# Patient Record
Sex: Female | Born: 2005 | Race: White | Hispanic: Yes | Marital: Single | Smoking: Never smoker
Health system: Southern US, Community
[De-identification: ages and names within clinical notes are randomized; demographics above are authoritative.]

## PROBLEM LIST (undated history)

## (undated) DIAGNOSIS — E739 Lactose intolerance, unspecified: Secondary | ICD-10-CM

## (undated) DIAGNOSIS — Z9109 Other allergy status, other than to drugs and biological substances: Secondary | ICD-10-CM

## (undated) DIAGNOSIS — J302 Other seasonal allergic rhinitis: Secondary | ICD-10-CM

---
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
## 2024-08-07 NOTE — Progress Notes (Signed)
 18 y.o. G0P0000 female here for new patient annual exam. Single. Presents with her mom. Lives with her dad.  Patient's last menstrual period was 07/29/2024 (exact date). Period Duration (Days): 4 Period Pattern: Regular Menstrual Flow: Heavy Menstrual Control: Maxi pad Dysmenorrhea: (!) Moderate Dysmenorrhea Symptoms: Headache  She reports establish care with GYN. She reports severe dysmenorrhea on CD1-3. Urine sample provided: No  Birth control: None Sexually active: 1 lifetime partner, +SA at age 35yo. Reports his parents were HIV positive, so she is worried. Gardasil: completed per mom  Flowsheet Row Office Visit from 08/08/2024 in [facility]  PHQ-2 Total Score 0    GYN HISTORY: No sig hx  OB History  Gravida Para Term Preterm AB Living  0 0 0 0 0   SAB IAB Ectopic Multiple Live Births  0 0 0     Past Medical History:  Diagnosis Date   Environmental allergies    Lactose intolerance    Seasonal allergies    History reviewed. No pertinent surgical history. No current outpatient medications on file prior to visit.   No current facility-administered medications on file prior to visit.   Allergies  Allergen Reactions   Lactose Intolerance (Gi)    Wheat       PE Today's Vitals   08/08/24 1517  BP: 104/74  Pulse: 99  Temp: 98.2 F (36.8 C)  TempSrc: Oral  SpO2: 99%  Weight: 225 lb (102.1 kg)  Height: 5' 1 (1.549 m)   Body mass index is 42.51 kg/m.  Physical Exam Vitals reviewed.  Constitutional:      General: She is not in acute distress.    Appearance: Normal appearance.  HENT:     Head: Normocephalic and atraumatic.     Nose: Nose normal.  Eyes:     Extraocular Movements: Extraocular movements intact.     Conjunctiva/sclera: Conjunctivae normal.  Pulmonary:     Effort: Pulmonary effort is normal.  Musculoskeletal:        General: Normal range of motion.     Cervical back: Normal range of motion.   Neurological:     General: No focal deficit present.     Mental Status: She is alert.  Psychiatric:        Mood and Affect: Mood normal.        Behavior: Behavior normal.      Assessment and Plan:        Well woman exam with routine gynecological exam Assessment & Plan: Encouraged safe sexual practices as indicated Encouraged healthy lifestyle practices with diet and exercise For patients under 50yo, I recommend 1000mg  calcium daily and 600IU of vitamin D daily.   Orders: -     CBC -     COMPLETE METABOLIC PANEL WITHOUT GFR  Negative depression screening  Screen for STD (sexually transmitted disease) -     Hepatitis B surface antigen -     Hepatitis C antibody -     RPR -     HIV Antibody (routine testing w rflx) -     C. trachomatis/N. gonorrhoeae RNA  Dysmenorrhea Assessment & Plan: Patient reporting severe dysmenorrhea. She is currently managing with Tylenol  and ibuprofen . Ultrasound isnot indicated based on symptoms. Discussed initial management, including NSAIDS and hormonal therapy. Patient elects for NSAIDS. RTO in 3 monts.   Orders: -     Naproxen; Take 1 tablet (500 mg total) by mouth 2 (two) times daily with a meal. Start 2 days  before your cycle. Take up to 5d per cycle.  Dispense: 30 tablet; Refill: 3    Vera LULLA Pa, MD

## 2024-08-08 ENCOUNTER — Encounter: Payer: Self-pay | Admitting: Obstetrics and Gynecology

## 2024-08-08 ENCOUNTER — Ambulatory Visit: Admitting: Obstetrics and Gynecology

## 2024-08-08 VITALS — BP 104/74 | HR 99 | Temp 98.2°F | Ht 61.0 in | Wt 225.0 lb

## 2024-08-08 DIAGNOSIS — Z01419 Encounter for gynecological examination (general) (routine) without abnormal findings: Secondary | ICD-10-CM | POA: Insufficient documentation

## 2024-08-08 DIAGNOSIS — Z1331 Encounter for screening for depression: Secondary | ICD-10-CM

## 2024-08-08 DIAGNOSIS — Z113 Encounter for screening for infections with a predominantly sexual mode of transmission: Secondary | ICD-10-CM

## 2024-08-08 DIAGNOSIS — N946 Dysmenorrhea, unspecified: Secondary | ICD-10-CM | POA: Insufficient documentation

## 2024-08-08 MED ORDER — NAPROXEN 500 MG PO TABS: 500.0000 mg | ORAL_TABLET | Freq: Two times a day (BID) | ORAL | 3 refills | Status: AC

## 2024-08-08 NOTE — Assessment & Plan Note (Signed)
 Encouraged safe sexual practices as indicated Encouraged healthy lifestyle practices with diet and exercise For patients under 19yo, I recommend 1000mg  calcium daily and 600IU of vitamin D daily.

## 2024-08-08 NOTE — Assessment & Plan Note (Signed)
 Patient reporting severe dysmenorrhea. She is currently managing with Tylenol  and ibuprofen . Ultrasound isnot indicated based on symptoms. Discussed initial management, including NSAIDS and hormonal therapy. Patient elects for NSAIDS. RTO in 3 monts.

## 2024-08-08 NOTE — Patient Instructions (Signed)

## 2024-08-09 ENCOUNTER — Ambulatory Visit (HOSPITAL_BASED_OUTPATIENT_CLINIC_OR_DEPARTMENT_OTHER): Payer: Self-pay | Admitting: Obstetrics and Gynecology

## 2024-08-09 LAB — CBC
HCT: 39.2 % (ref 35.0–45.0)
Hemoglobin: 13.3 g/dL (ref 11.7–15.5)
MCH: 29 pg (ref 27.0–33.0)
MCHC: 33.9 g/dL (ref 32.0–36.0)
MCV: 85.4 fL (ref 80.0–100.0)
MPV: 10.2 fL (ref 7.5–12.5)
Platelets: 495 Thousand/uL — ABNORMAL HIGH (ref 140–400)
RBC: 4.59 Million/uL (ref 3.80–5.10)
RDW: 13.3 % (ref 11.0–15.0)
WBC: 13.2 Thousand/uL — ABNORMAL HIGH (ref 3.8–10.8)

## 2024-08-09 LAB — COMPLETE METABOLIC PANEL WITHOUT GFR
AG Ratio: 1.4 (calc) (ref 1.0–2.5)
ALT: 28 U/L (ref 5–32)
AST: 22 U/L (ref 12–32)
Albumin: 4.8 g/dL (ref 3.6–5.1)
Alkaline phosphatase (APISO): 71 U/L (ref 36–128)
BUN: 8 mg/dL (ref 7–20)
CO2: 27 mmol/L (ref 20–32)
Calcium: 10.2 mg/dL (ref 8.9–10.4)
Chloride: 101 mmol/L (ref 98–110)
Creat: 0.8 mg/dL (ref 0.50–0.96)
Globulin: 3.5 g/dL (ref 2.0–3.8)
Glucose, Bld: 78 mg/dL (ref 65–99)
Potassium: 4.3 mmol/L (ref 3.8–5.1)
Sodium: 138 mmol/L (ref 135–146)
Total Bilirubin: 0.3 mg/dL (ref 0.2–1.1)
Total Protein: 8.3 g/dL — ABNORMAL HIGH (ref 6.3–8.2)

## 2024-08-09 LAB — HEPATITIS C ANTIBODY: Hepatitis C Ab: NONREACTIVE

## 2024-08-09 LAB — RPR: RPR Ser Ql: NONREACTIVE

## 2024-08-09 LAB — HEPATITIS B SURFACE ANTIGEN: Hepatitis B Surface Ag: NONREACTIVE

## 2024-08-09 LAB — HIV ANTIBODY (ROUTINE TESTING W REFLEX)
HIV 1&2 Ab, 4th Generation: NONREACTIVE
HIV FINAL INTERPRETATION: NEGATIVE

## 2024-08-09 LAB — C. TRACHOMATIS/N. GONORRHOEAE RNA
C. trachomatis RNA, TMA: NOT DETECTED
N. gonorrhoeae RNA, TMA: NOT DETECTED

## 2024-08-29 ENCOUNTER — Encounter: Admitting: Obstetrics and Gynecology

## 2024-11-12 ENCOUNTER — Ambulatory Visit (INDEPENDENT_AMBULATORY_CARE_PROVIDER_SITE_OTHER): Admitting: Obstetrics and Gynecology

## 2024-11-12 ENCOUNTER — Encounter: Payer: Self-pay | Admitting: Obstetrics and Gynecology

## 2024-11-12 VITALS — BP 98/62 | HR 112 | Temp 98.1°F | Wt 219.0 lb

## 2024-11-12 DIAGNOSIS — N946 Dysmenorrhea, unspecified: Secondary | ICD-10-CM

## 2024-11-12 DIAGNOSIS — Z3009 Encounter for other general counseling and advice on contraception: Secondary | ICD-10-CM

## 2024-11-12 MED ORDER — ELLA 30 MG PO TABS: 1.0000 | ORAL_TABLET | Freq: Once | ORAL | 0 refills | Status: AC

## 2024-11-12 NOTE — Progress Notes (Signed)
" ° °  19 y.o. G0P0000 female here for f/u of severe dysmenorrhea. Single. Presents with her friend today.  Patient's last menstrual period was 10/23/2024 (exact date). Period Duration (Days): 6 Period Pattern: Regular Menstrual Flow: Heavy Menstrual Control: Thin pad Dysmenorrhea: (!) Moderate 08/08/24: She reports severe dysmenorrhea on CD1-3.  She was started on naproxen . She has been taking the medication for the first 1-3 days of menstrual. After that, pain goes away.  Nausea resolved and cramping is much better. She is happy with this method. She is not currently sexually active and declines contraception. She is applying for the [employer].  Sexually active: 1 lifetime partner, +SA at age 36yo. Reports his parents were HIV positive, so she is worried. Birth control: none   OB History  Gravida Para Term Preterm AB Living  0 0 0 0 0   SAB IAB Ectopic Multiple Live Births  0 0 0     Past Medical History:  Diagnosis Date   Environmental allergies    Lactose intolerance    Seasonal allergies    History reviewed. No pertinent surgical history. Medications Ordered Prior to Encounter[1] Allergies[2]    PE Today's Vitals   11/12/24 0917  BP: 98/62  Pulse: (!) 112  Temp: 98.1 F (36.7 C)  TempSrc: Oral  SpO2: 98%  Weight: 219 lb (99.3 kg)   Body mass index is 41.38 kg/m.  Physical Exam Vitals reviewed.  Constitutional:      General: She is not in acute distress.    Appearance: Normal appearance.  HENT:     Head: Normocephalic and atraumatic.     Nose: Nose normal.  Eyes:     Extraocular Movements: Extraocular movements intact.     Conjunctiva/sclera: Conjunctivae normal.  Pulmonary:     Effort: Pulmonary effort is normal.  Musculoskeletal:        General: Normal range of motion.     Cervical back: Normal range of motion.  Neurological:     General: No focal deficit present.     Mental Status: She is alert.  Psychiatric:        Mood and Affect: Mood normal.         Behavior: Behavior normal.      Assessment and Plan:        Dysmenorrhea Assessment & Plan: Patient reporting severe dysmenorrhea.  Improved with naproxen .  Declines hormonal therapy.  Will continue with this method.  RTO for annual or earlier if needed.   Ultrasound not indicated at this time.    Encounter for counseling regarding contraception -     Ella ; Take 1 tablet (30 mg total) by mouth once for 1 dose.  Dispense: 1 tablet; Refill: 0  Declines ongoing contraception  Rx for emergency contraception provided PRN  Vera LULLA Pa, MD     [1]  Current Outpatient Medications on File Prior to Visit  Medication Sig Dispense Refill   naproxen  (NAPROSYN ) 500 MG tablet Take 1 tablet (500 mg total) by mouth 2 (two) times daily with a meal. Start 2 days before your cycle. Take up to 5d per cycle. 30 tablet 3   No current facility-administered medications on file prior to visit.  [2]  Allergies Allergen Reactions   Lactose Intolerance (Gi)    Wheat    "

## 2024-11-12 NOTE — Assessment & Plan Note (Signed)
 Patient reporting severe dysmenorrhea.  Improved with naproxen .  Declines hormonal therapy.  Will continue with this method.  RTO for annual or earlier if needed.   Ultrasound not indicated at this time.

## 2025-08-11 ENCOUNTER — Ambulatory Visit: Admitting: Obstetrics and Gynecology
